# Patient Record
Sex: Male | Born: 1990 | Race: White | Hispanic: No | Marital: Single | Smoking: Current every day smoker
Health system: Southern US, Community
[De-identification: ages and names within clinical notes are randomized; demographics above are authoritative.]

## PROBLEM LIST (undated history)

## (undated) DIAGNOSIS — T7840XA Allergy, unspecified, initial encounter: Secondary | ICD-10-CM

## (undated) HISTORY — DX: Allergy, unspecified, initial encounter: T78.40XA

---
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
## 2008-05-22 ENCOUNTER — Ambulatory Visit: Payer: Self-pay | Admitting: Family Medicine

## 2008-05-22 DIAGNOSIS — J309 Allergic rhinitis, unspecified: Secondary | ICD-10-CM | POA: Insufficient documentation

## 2008-05-22 DIAGNOSIS — J209 Acute bronchitis, unspecified: Secondary | ICD-10-CM

## 2008-08-12 IMAGING — CT CT ABDOMEN W/ CM
2 of 5 series · 17 of 46 positions shown, 19 images · IV contrast (APPLIED)
Comparison: none

CLINICAL DATA: Recurrent right lower abdominal pain.

[Series 2: abd_pel 5.0 b40f st · axial · 0.74mm/px · z∈[-480,-50]mm · 14 of 98 slices shown, 16 images]
[im 6/98  soft-tissue]
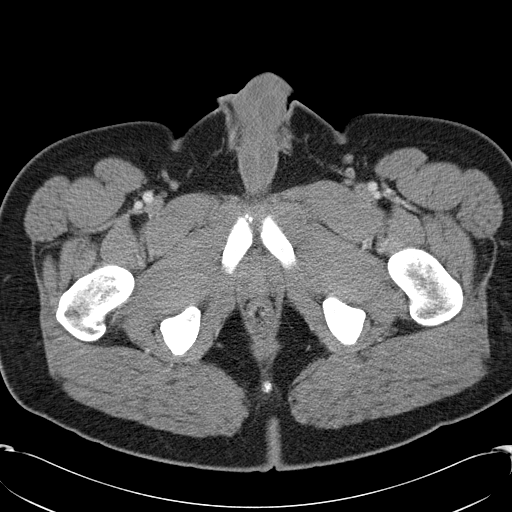
[im 6/98  bone]
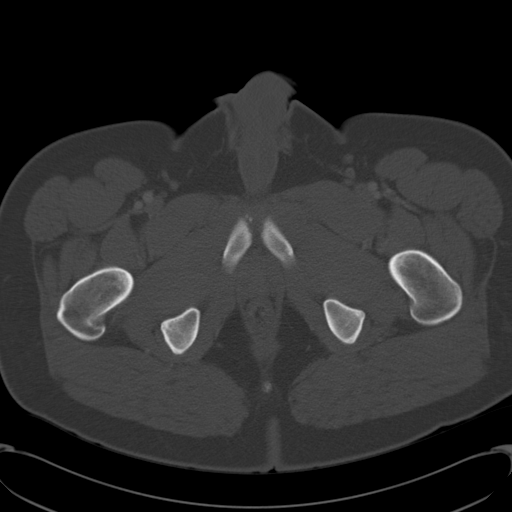
[im 11/98  soft-tissue]
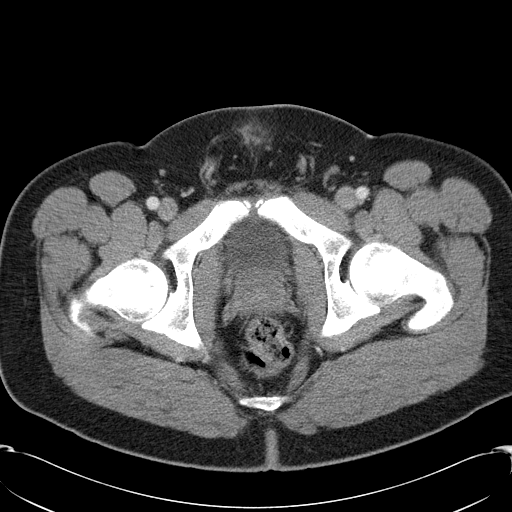
[im 21/98  soft-tissue]
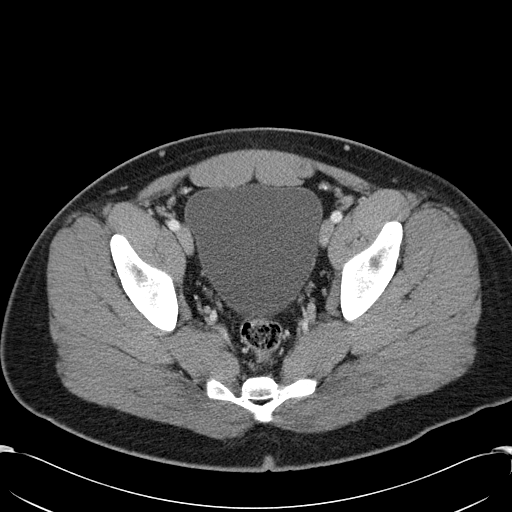
[im 26/98  soft-tissue]
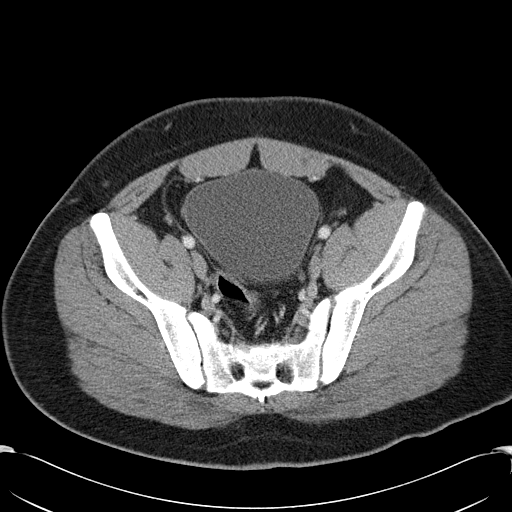
[im 31/98  soft-tissue]
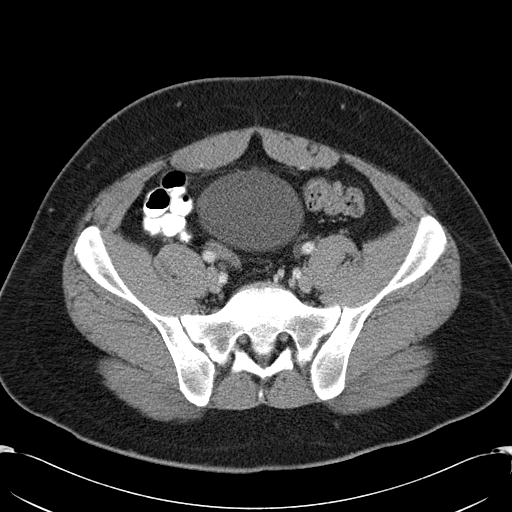
[im 41/98  soft-tissue]
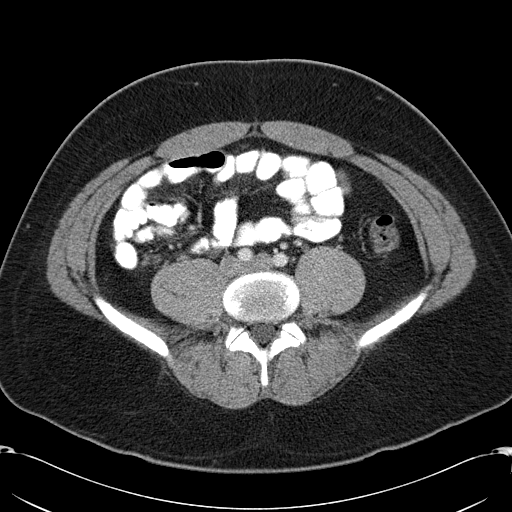
[im 46/98  soft-tissue]
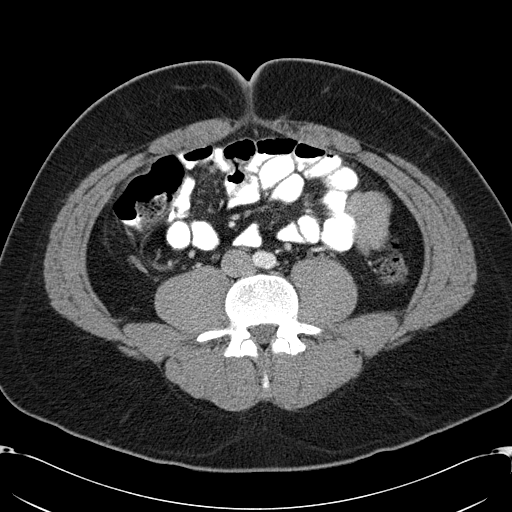
[im 52/98  soft-tissue]
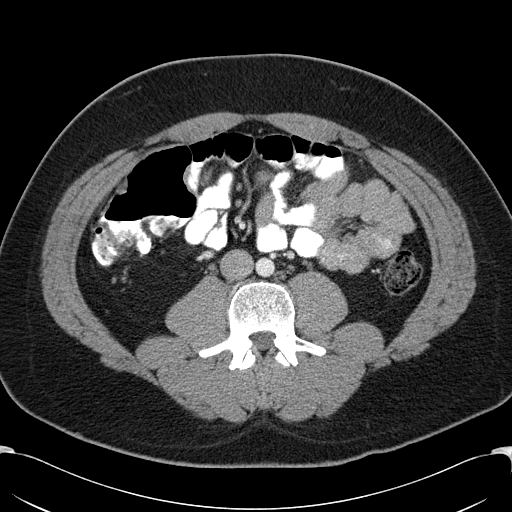
[im 57/98  soft-tissue]
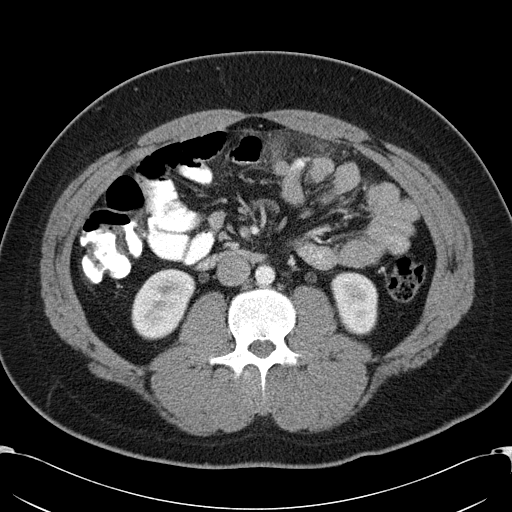
[im 57/98  bone]
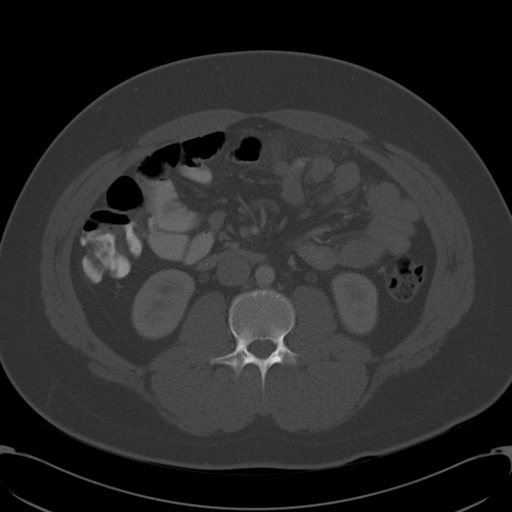
[im 67/98  soft-tissue]
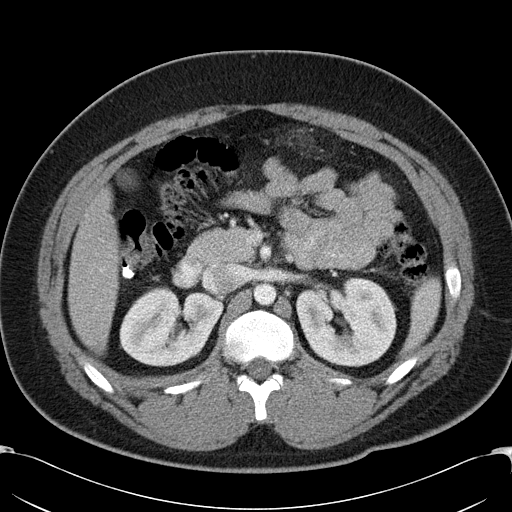
[im 72/98  soft-tissue]
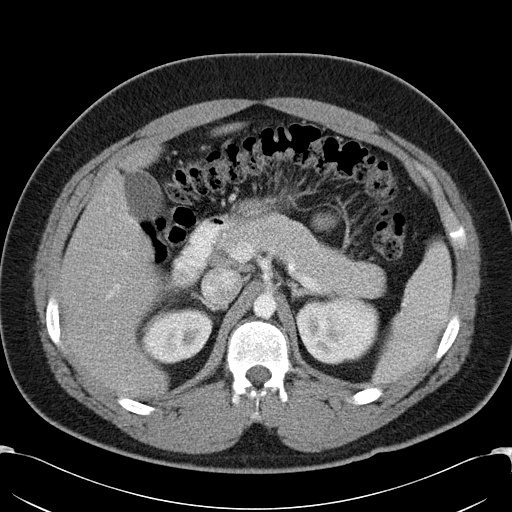
[im 77/98  soft-tissue]
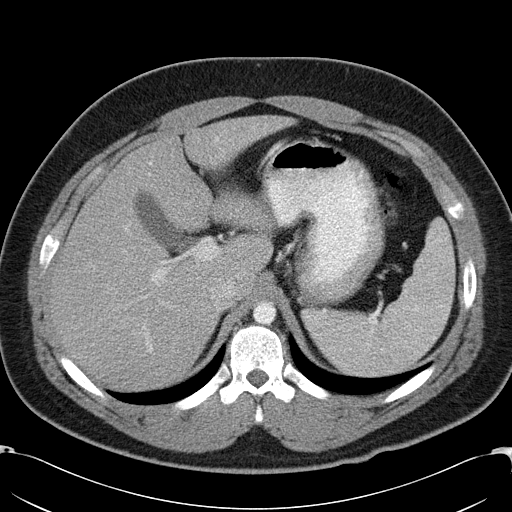
[im 87/98  soft-tissue]
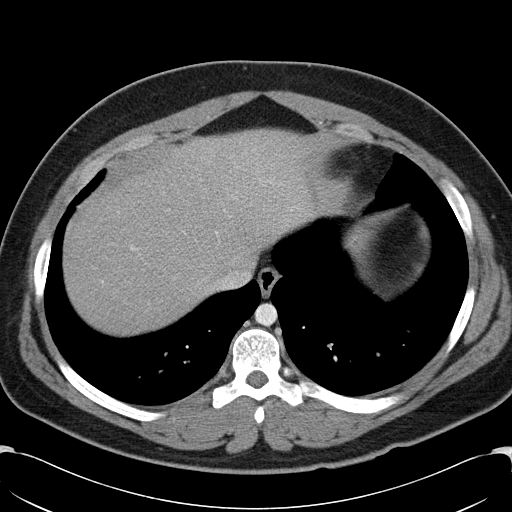
[im 92/98  soft-tissue]
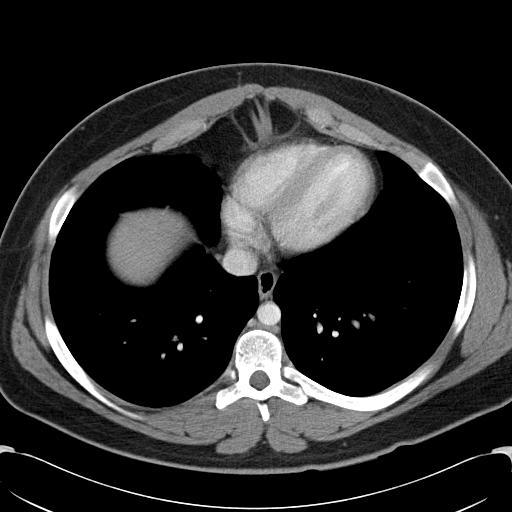

[Series 602: coronal abdomen · coronal · 0.99mm/px · 3 of 124 slices shown]
[im 42/124  soft-tissue]
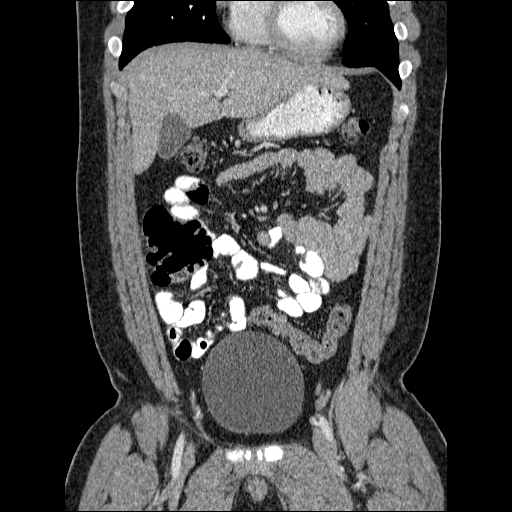
[im 55/124  soft-tissue]
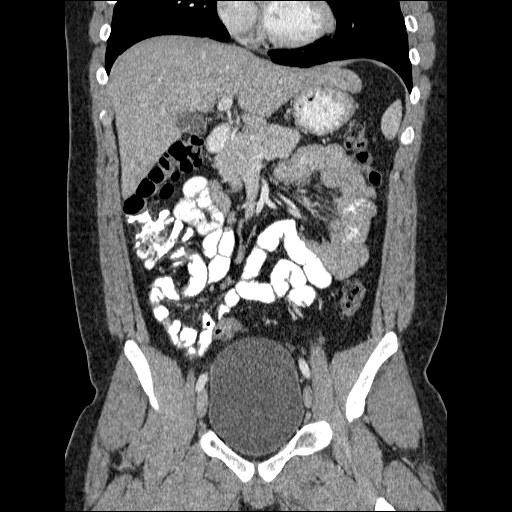
[im 69/124  soft-tissue]
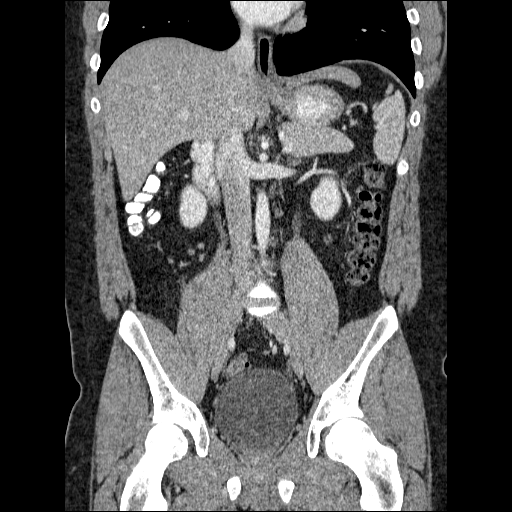

[17 of 46 positions shown; findings below may reference images not displayed]

CT abdomen with contrast:

Multidetector helical CT after 125  ml Hmnipaque-U44 IV.
No previous for comparison. Visualized lung bases clear. Unremarkable liver,
gallbladder, spleen, adrenal glands, kidneys, pancreas, aorta, small bowel. No
free air. No ascites. There is a focal region of   edema or inflammation in the
mesenteric fat in the anterior left midabdomen. This is adjacent to several
loops of small bowel and inferior to the transverse colon, but there is no
evidence of associated bowel wall abnormality, wall thickening, extraluminal
fluid or gas.
IMPRESSION: 1. Inflammatory/edematous change in anterior   mesenteric fat, left midabdomen,
without evident etiology. Considerations include epiploic appendagitis from the
transverse colon or a localized panniculitis.

CT pelvis with contrast:

Appendix not discretely identified. The colon is nondilated, unremarkable.
Urinary bladder physiologically distended. No free fluid. No adenopathy.
IMPRESSION: 1. Unremarkable CT pelvis

## 2009-01-10 ENCOUNTER — Ambulatory Visit: Payer: Self-pay | Admitting: Family Medicine

## 2011-07-25 LAB — CBC
HCT: 46.7
MCHC: 35.2
MCV: 87.8
Platelets: 258
RBC: 5.32

## 2011-07-25 LAB — LIPASE, BLOOD: Lipase: 20

## 2011-07-25 LAB — URINALYSIS, ROUTINE W REFLEX MICROSCOPIC
Bilirubin Urine: NEGATIVE
Glucose, UA: NEGATIVE
Hgb urine dipstick: NEGATIVE
Protein, ur: NEGATIVE
Urobilinogen, UA: 1

## 2011-07-25 LAB — COMPREHENSIVE METABOLIC PANEL
AST: 27
BUN: 11
CO2: 28
Calcium: 9.3
Creatinine, Ser: 0.91
GFR calc Af Amer: >60
GFR calc non Af Amer: >60
Total Bilirubin: 0.7

## 2011-07-25 LAB — DIFFERENTIAL
Basophils Absolute: 0
Eosinophils Relative: 4
Lymphocytes Relative: 18
Lymphs Abs: 2.1
Neutro Abs: 8.2 — ABNORMAL HIGH
Neutrophils Relative %: 71

## 2014-01-18 ENCOUNTER — Ambulatory Visit (INDEPENDENT_AMBULATORY_CARE_PROVIDER_SITE_OTHER): Payer: PRIVATE HEALTH INSURANCE | Admitting: Family Medicine

## 2014-01-18 VITALS — BP 138/86 | HR 96 | Temp 97.9°F | Resp 18 | Ht 66.5 in | Wt 299.4 lb

## 2014-01-18 DIAGNOSIS — S61409A Unspecified open wound of unspecified hand, initial encounter: Secondary | ICD-10-CM

## 2014-01-18 DIAGNOSIS — M79609 Pain in unspecified limb: Secondary | ICD-10-CM

## 2014-01-18 DIAGNOSIS — Z23 Encounter for immunization: Secondary | ICD-10-CM

## 2014-01-18 NOTE — Progress Notes (Signed)
   Subjective:    Patient ID: Cody Simpson, Cody Simpson    DOB: 31-Dec-1990, 23 y.o.   MRN: [id number]  HPI 23 year old Cody Simpson presents for evaluation of left hand laceration. Injury occurred today at 11:00 a.m while cutting fruit with a paring knife. Wound is of the left thenar eminence. Patient reports he had quickclot material at home and used a copious amount to stop the bleeding. He then put on a pressure dressing and came here.  Denies weakness or paresthesias in his hand.  Last tetanus unknown.      Review of Systems  Musculoskeletal: Negative for joint swelling.  Skin: Positive for wound.  Neurological: Negative for weakness and numbness.       Objective:   Physical Exam  Constitutional: He is oriented to person, place, and time. He appears well-developed and well-nourished.  HENT:  Head: Normocephalic and atraumatic.  Right Ear: External ear normal.  Left Ear: External ear normal.  Eyes: Conjunctivae are normal.  Neck: Normal range of motion.  Cardiovascular: Normal rate.   Pulmonary/Chest: Effort normal.  Neurological: He is alert and oriented to person, place, and time.  Skin:  1 cm laceration of left thenar eminence. 5/5 strength of all fingers. Sensation intact. Capillary refill normal <2 seconds.   Psychiatric: He has a normal mood and affect. His behavior is normal. Judgment and thought content normal.         Procedure: VCO. Local anesthesia with 2% lidocaine plain. Wound cleaned with soap and water and all remaining quickclot material removed.  Irrigated. SP.  Explored revealing no FB or deep structure involvement.  Repaired with #2 SI sutures using 4-0 ethilon.  Cleaned and bandaged. Patient tolerated well.   Assessment & Plan:  Need for Tdap vaccination - Plan: Tdap vaccine greater than or equal to 7yo IM  Open wound of hand except finger(s) alone, without mention of complication - Plan: Tdap vaccine greater than or equal to 7yo IM  Sutures placed and wound  bandaged Tdap updated.  Wound care instructions discussed with patient.  Return in 7-10 days for suture removal.

## 2014-01-18 NOTE — Patient Instructions (Signed)

## 2014-08-23 NOTE — Progress Notes (Signed)
History and physical examinations reviewed with Rhoderick MoodyHeather Marte, PA-C. Agree with assessment and plan.
